# Patient Record
Sex: Female | Born: 1980 | Race: White | Hispanic: No | Marital: Married | Smoking: Never smoker
Health system: Southern US, Community
[De-identification: ages and names within clinical notes are randomized; demographics above are authoritative.]

## PROBLEM LIST (undated history)

## (undated) DIAGNOSIS — C801 Malignant (primary) neoplasm, unspecified: Secondary | ICD-10-CM

## (undated) DIAGNOSIS — N390 Urinary tract infection, site not specified: Secondary | ICD-10-CM

## (undated) DIAGNOSIS — Z9889 Other specified postprocedural states: Secondary | ICD-10-CM

## (undated) DIAGNOSIS — R112 Nausea with vomiting, unspecified: Secondary | ICD-10-CM

## (undated) DIAGNOSIS — K279 Peptic ulcer, site unspecified, unspecified as acute or chronic, without hemorrhage or perforation: Secondary | ICD-10-CM

## (undated) DIAGNOSIS — J45909 Unspecified asthma, uncomplicated: Secondary | ICD-10-CM

## (undated) HISTORY — PX: TUBAL LIGATION: SHX77

## (undated) HISTORY — PX: SKIN TAG REMOVAL: SHX780

## (undated) HISTORY — PX: BUNIONECTOMY: SHX129

## (undated) HISTORY — PX: LEEP: SHX91

## (undated) HISTORY — PX: TENDON REPAIR: SHX5111

## (undated) HISTORY — PX: BLADDER SURGERY: SHX569

## (undated) HISTORY — PX: OTHER SURGICAL HISTORY: SHX169

## (undated) HISTORY — PX: LUNG REMOVAL, PARTIAL: SHX233

## (undated) HISTORY — PX: CARPAL TUNNEL RELEASE: SHX101

## (undated) HISTORY — PX: CHOLECYSTECTOMY: SHX55

## (undated) HISTORY — PX: TONSILLECTOMY: SUR1361

## (undated) HISTORY — PX: SKIN CANCER EXCISION: SHX779

---

## 2021-07-10 ENCOUNTER — Other Ambulatory Visit: Payer: Self-pay | Admitting: Family Medicine

## 2021-07-10 DIAGNOSIS — G8929 Other chronic pain: Secondary | ICD-10-CM

## 2021-07-18 ENCOUNTER — Other Ambulatory Visit: Payer: Self-pay

## 2021-07-18 ENCOUNTER — Ambulatory Visit
Admission: RE | Admit: 2021-07-18 | Discharge: 2021-07-18 | Disposition: A | Discharge status: Home / Self Care | Payer: Medicare Other | Source: Ambulatory Visit | Attending: Family Medicine | Admitting: Family Medicine

## 2021-07-18 DIAGNOSIS — G8929 Other chronic pain: Secondary | ICD-10-CM | POA: Insufficient documentation

## 2021-07-18 DIAGNOSIS — M5441 Lumbago with sciatica, right side: Secondary | ICD-10-CM | POA: Diagnosis present

## 2021-07-20 ENCOUNTER — Other Ambulatory Visit: Payer: Self-pay

## 2021-07-20 ENCOUNTER — Ambulatory Visit
Admission: RE | Admit: 2021-07-20 | Discharge: 2021-07-20 | Disposition: A | Discharge status: Home / Self Care | Payer: Medicare Other | Source: Ambulatory Visit | Attending: General Surgery | Admitting: General Surgery

## 2021-07-20 ENCOUNTER — Other Ambulatory Visit: Payer: Self-pay | Admitting: General Surgery

## 2021-07-20 ENCOUNTER — Ambulatory Visit: Payer: Self-pay | Admitting: General Surgery

## 2021-07-20 DIAGNOSIS — K439 Ventral hernia without obstruction or gangrene: Secondary | ICD-10-CM | POA: Diagnosis not present

## 2021-07-26 ENCOUNTER — Encounter
Admission: RE | Admit: 2021-07-26 | Discharge: 2021-07-26 | Disposition: A | Discharge status: Home / Self Care | Payer: Medicare Other | Source: Ambulatory Visit | Attending: General Surgery | Admitting: General Surgery

## 2021-07-26 ENCOUNTER — Other Ambulatory Visit: Payer: Self-pay

## 2021-07-26 HISTORY — DX: Urinary tract infection, site not specified: N39.0

## 2021-07-26 HISTORY — DX: Peptic ulcer, site unspecified, unspecified as acute or chronic, without hemorrhage or perforation: K27.9

## 2021-07-26 HISTORY — DX: Nausea with vomiting, unspecified: R11.2

## 2021-07-26 HISTORY — DX: Malignant (primary) neoplasm, unspecified: C80.1

## 2021-07-26 HISTORY — DX: Other specified postprocedural states: Z98.890

## 2021-07-26 HISTORY — DX: Unspecified asthma, uncomplicated: J45.909

## 2021-07-26 NOTE — Patient Instructions (Signed)
Your procedure is scheduled on: 07/30/21 Report to [place]. To find out your arrival time please call [phone] between 1PM - 3PM on 07/27/21.  Remember: Instructions that are not followed completely may result in serious medical risk, up to and including death, or upon the discretion of your surgeon and anesthesiologist your surgery may need to be rescheduled.     _X__ 1. Do not eat food after midnight the night before your procedure.                 No gum chewing or hard candies. You may drink clear liquids up to 2 hours                 before you are scheduled to arrive for your surgery- DO not drink clear                 liquids within 2 hours of the start of your surgery.                 Clear Liquids include:  water, apple juice without pulp, clear carbohydrate                 drink such as Clearfast or Gatorade, Black Coffee or Tea (Do not add                 anything to coffee or tea). Diabetics water only  __X__2.  On the morning of surgery brush your teeth with toothpaste and water, you                 may rinse your mouth with mouthwash if you wish.  Do not swallow any              toothpaste of mouthwash.     _X__ 3.  No Alcohol for 24 hours before or after surgery.   _X__ 4.  Do Not Smoke or use e-cigarettes For 24 Hours Prior to Your Surgery.                 Do not use any chewable tobacco products for at least 6 hours prior to                 surgery.  ____  5.  Bring all medications with you on the day of surgery if instructed.   __X__  6.  Notify your doctor if there is any change in your medical condition      (cold, fever, infections).     Do not wear jewelry, make-up, hairpins, clips or nail polish. Do not wear lotions, powders, or perfumes.  Do not shave 48 hours prior to surgery. Men may shave face and neck. Do not bring valuables to the hospital.    [facility] is not responsible for any belongings  or valuables.  Contacts, dentures/partials or body piercings may not be worn into surgery. Bring a case for your contacts, glasses or hearing aids, a denture cup will be supplied. Leave your suitcase in the car. After surgery it may be brought to your room. For patients admitted to the hospital, discharge time is determined by your treatment team.   Patients discharged the day of surgery will not be allowed to drive home.   Please read over the following fact sheets that you were given:     __X__ Take these medicines the morning of surgery with A SIP OF WATER:  1. none  2.   3.   4.  5.  6.  ____ Fleet Enema (as directed)   ____ Use CHG Soap/SAGE wipes as directed  ____ Use inhalers on the day of surgery  ____ Stop metformin/Janumet/Farxiga 2 days prior to surgery    ____ Take 1/2 of usual insulin dose the night before surgery. No insulin the morning          of surgery.   ____ Stop Blood Thinners Coumadin/Plavix/Xarelto/Pleta/Pradaxa/Eliquis/Effient/Aspirin  on   Or contact your Surgeon, Cardiologist or Medical Doctor regarding  ability to stop your blood thinners  __X__ Stop Anti-inflammatories 7 days before surgery such as Advil, Ibuprofen, Motrin,  BC or Goodies Powder, Naprosyn, Naproxen, Aleve, Aspirin    __X__ Stop all herbal supplements, fish oil or vitamin E until after surgery. Stop all supplements today 07/26/21   ____ Bring C-Pap to the hospital.

## 2021-07-29 MED ORDER — LACTATED RINGERS IV SOLN: INTRAVENOUS | Status: DC

## 2021-07-29 MED ORDER — CEFAZOLIN SODIUM-DEXTROSE 2-4 GM/100ML-% IV SOLN
2.0000 g | INTRAVENOUS | Status: AC
  Administered 2021-07-30: 2 g via INTRAVENOUS

## 2021-07-29 MED ORDER — ORAL CARE MOUTH RINSE: 15.0000 mL | Freq: Once | OROMUCOSAL | Status: AC

## 2021-07-29 MED ORDER — FAMOTIDINE 20 MG PO TABS: 20.0000 mg | ORAL_TABLET | Freq: Once | ORAL | Status: AC

## 2021-07-29 MED ORDER — APREPITANT 40 MG PO CAPS: 40.0000 mg | ORAL_CAPSULE | Freq: Once | ORAL | Status: AC

## 2021-07-29 MED ORDER — CHLORHEXIDINE GLUCONATE 0.12 % MT SOLN: 15.0000 mL | Freq: Once | OROMUCOSAL | Status: AC

## 2021-07-30 ENCOUNTER — Encounter
Admission: RE | Disposition: A | Discharge status: Home / Self Care | Payer: Self-pay | Source: Home / Self Care | Attending: General Surgery

## 2021-07-30 ENCOUNTER — Ambulatory Visit
Admission: RE | Admit: 2021-07-30 | Discharge: 2021-07-30 | Disposition: A | Discharge status: Home / Self Care | Payer: Medicare Other | Attending: General Surgery | Admitting: General Surgery

## 2021-07-30 ENCOUNTER — Ambulatory Visit: Payer: Medicare Other | Admitting: Certified Registered"

## 2021-07-30 ENCOUNTER — Other Ambulatory Visit: Payer: Self-pay

## 2021-07-30 ENCOUNTER — Encounter: Payer: Self-pay | Admitting: General Surgery

## 2021-07-30 DIAGNOSIS — K436 Other and unspecified ventral hernia with obstruction, without gangrene: Secondary | ICD-10-CM | POA: Diagnosis not present

## 2021-07-30 DIAGNOSIS — Z85828 Personal history of other malignant neoplasm of skin: Secondary | ICD-10-CM | POA: Insufficient documentation

## 2021-07-30 DIAGNOSIS — Z902 Acquired absence of lung [part of]: Secondary | ICD-10-CM | POA: Insufficient documentation

## 2021-07-30 HISTORY — PX: INSERTION OF MESH: SHX5868

## 2021-07-30 HISTORY — PX: XI ROBOTIC ASSISTED VENTRAL HERNIA: SHX6789

## 2021-07-30 LAB — POCT PREGNANCY, URINE: Preg Test, Ur: NEGATIVE

## 2021-07-30 SURGERY — REPAIR, HERNIA, VENTRAL, ROBOT-ASSISTED
Anesthesia: General

## 2021-07-30 MED ORDER — EPHEDRINE SULFATE 50 MG/ML IJ SOLN
INTRAMUSCULAR | Status: DC | PRN
  Administered 2021-07-30: 5 mg via INTRAVENOUS

## 2021-07-30 MED ORDER — LIDOCAINE HCL (CARDIAC) PF 100 MG/5ML IV SOSY
PREFILLED_SYRINGE | INTRAVENOUS | Status: DC | PRN
  Administered 2021-07-30: 60 mg via INTRAVENOUS

## 2021-07-30 MED ORDER — FENTANYL CITRATE (PF) 100 MCG/2ML IJ SOLN
25.0000 ug | INTRAMUSCULAR | Status: AC | PRN
  Administered 2021-07-30 (×6): 25 ug via INTRAVENOUS

## 2021-07-30 MED ORDER — FENTANYL CITRATE (PF) 100 MCG/2ML IJ SOLN
INTRAMUSCULAR | Status: AC
  Filled 2021-07-30: qty 2

## 2021-07-30 MED ORDER — FAMOTIDINE 20 MG PO TABS
ORAL_TABLET | ORAL | Status: AC
  Administered 2021-07-30: 20 mg via ORAL
  Filled 2021-07-30: qty 1

## 2021-07-30 MED ORDER — ROCURONIUM BROMIDE 100 MG/10ML IV SOLN
INTRAVENOUS | Status: DC | PRN
  Administered 2021-07-30: 60 mg via INTRAVENOUS

## 2021-07-30 MED ORDER — ACETAMINOPHEN 10 MG/ML IV SOLN
INTRAVENOUS | Status: DC | PRN
  Administered 2021-07-30: 1000 mg via INTRAVENOUS

## 2021-07-30 MED ORDER — MIDAZOLAM HCL 2 MG/2ML IJ SOLN
INTRAMUSCULAR | Status: AC
  Filled 2021-07-30: qty 2

## 2021-07-30 MED ORDER — BUPIVACAINE-EPINEPHRINE (PF) 0.25% -1:200000 IJ SOLN
INTRAMUSCULAR | Status: AC
  Filled 2021-07-30: qty 30

## 2021-07-30 MED ORDER — CEFAZOLIN SODIUM-DEXTROSE 2-4 GM/100ML-% IV SOLN
INTRAVENOUS | Status: AC
  Filled 2021-07-30: qty 100

## 2021-07-30 MED ORDER — ACETAMINOPHEN 10 MG/ML IV SOLN
INTRAVENOUS | Status: AC
  Filled 2021-07-30: qty 100

## 2021-07-30 MED ORDER — DEXAMETHASONE SODIUM PHOSPHATE 10 MG/ML IJ SOLN
INTRAMUSCULAR | Status: DC | PRN
  Administered 2021-07-30: 10 mg via INTRAVENOUS

## 2021-07-30 MED ORDER — PROPOFOL 10 MG/ML IV BOLUS
INTRAVENOUS | Status: DC | PRN
  Administered 2021-07-30: 180 mg via INTRAVENOUS

## 2021-07-30 MED ORDER — ONDANSETRON HCL 4 MG/2ML IJ SOLN: 4.0000 mg | Freq: Once | INTRAMUSCULAR | Status: DC | PRN

## 2021-07-30 MED ORDER — HYDROCODONE-ACETAMINOPHEN 5-325 MG PO TABS
1.0000 | ORAL_TABLET | Freq: Once | ORAL | Status: AC
  Administered 2021-07-30: 1 via ORAL

## 2021-07-30 MED ORDER — SUGAMMADEX SODIUM 200 MG/2ML IV SOLN
INTRAVENOUS | Status: DC | PRN
  Administered 2021-07-30: 200 mg via INTRAVENOUS

## 2021-07-30 MED ORDER — MIDAZOLAM HCL 2 MG/2ML IJ SOLN
INTRAMUSCULAR | Status: DC | PRN
  Administered 2021-07-30: 2 mg via INTRAVENOUS

## 2021-07-30 MED ORDER — DEXMEDETOMIDINE (PRECEDEX) IN NS 20 MCG/5ML (4 MCG/ML) IV SYRINGE
PREFILLED_SYRINGE | INTRAVENOUS | Status: DC | PRN
  Administered 2021-07-30: 12 ug via INTRAVENOUS
  Administered 2021-07-30: 8 ug via INTRAVENOUS
  Administered 2021-07-30: 12 ug via INTRAVENOUS

## 2021-07-30 MED ORDER — BUPIVACAINE-EPINEPHRINE (PF) 0.25% -1:200000 IJ SOLN
INTRAMUSCULAR | Status: DC | PRN
  Administered 2021-07-30: 30 mL

## 2021-07-30 MED ORDER — FENTANYL CITRATE (PF) 100 MCG/2ML IJ SOLN
INTRAMUSCULAR | Status: AC
  Administered 2021-07-30: 25 ug via INTRAVENOUS
  Filled 2021-07-30: qty 2

## 2021-07-30 MED ORDER — ONDANSETRON HCL 4 MG/2ML IJ SOLN
INTRAMUSCULAR | Status: DC | PRN
  Administered 2021-07-30 (×2): 4 mg via INTRAVENOUS

## 2021-07-30 MED ORDER — CHLORHEXIDINE GLUCONATE 0.12 % MT SOLN
OROMUCOSAL | Status: AC
  Administered 2021-07-30: 15 mL via OROMUCOSAL
  Filled 2021-07-30: qty 15

## 2021-07-30 MED ORDER — KETOROLAC TROMETHAMINE 30 MG/ML IJ SOLN
INTRAMUSCULAR | Status: DC | PRN
  Administered 2021-07-30: 30 mg via INTRAVENOUS

## 2021-07-30 MED ORDER — HYDROCODONE-ACETAMINOPHEN 5-325 MG PO TABS
ORAL_TABLET | ORAL | Status: AC
  Filled 2021-07-30: qty 1

## 2021-07-30 MED ORDER — HYDROCODONE-ACETAMINOPHEN 5-325 MG PO TABS: 1.0000 | ORAL_TABLET | ORAL | 0 refills | Status: AC | PRN

## 2021-07-30 MED ORDER — GLYCOPYRROLATE 0.2 MG/ML IJ SOLN
INTRAMUSCULAR | Status: DC | PRN
  Administered 2021-07-30: .2 mg via INTRAVENOUS

## 2021-07-30 MED ORDER — APREPITANT 40 MG PO CAPS
ORAL_CAPSULE | ORAL | Status: AC
  Administered 2021-07-30: 40 mg via ORAL
  Filled 2021-07-30: qty 1

## 2021-07-30 MED ORDER — FENTANYL CITRATE (PF) 100 MCG/2ML IJ SOLN
INTRAMUSCULAR | Status: DC | PRN
  Administered 2021-07-30: 100 ug via INTRAVENOUS

## 2021-07-30 SURGICAL SUPPLY — 45 items
BLADE SURG SZ11 CARB STEEL (BLADE) ×3 IMPLANT
CHLORAPREP W/TINT 26 (MISCELLANEOUS) ×3 IMPLANT
COVER TIP SHEARS 8 DVNC (MISCELLANEOUS) ×2 IMPLANT
COVER TIP SHEARS 8MM DA VINCI (MISCELLANEOUS) ×1
COVER WAND RF STERILE (DRAPES) ×3 IMPLANT
DEFOGGER SCOPE WARMER CLEARIFY (MISCELLANEOUS) ×3 IMPLANT
DERMABOND ADVANCED (GAUZE/BANDAGES/DRESSINGS) ×1
DERMABOND ADVANCED .7 DNX12 (GAUZE/BANDAGES/DRESSINGS) ×2 IMPLANT
DRAPE ARM DVNC X/XI (DISPOSABLE) ×6 IMPLANT
DRAPE COLUMN DVNC XI (DISPOSABLE) ×2 IMPLANT
DRAPE DA VINCI XI ARM (DISPOSABLE) ×3
DRAPE DA VINCI XI COLUMN (DISPOSABLE) ×1
ELECT REM PT RETURN 9FT ADLT (ELECTROSURGICAL) ×3
ELECTRODE REM PT RTRN 9FT ADLT (ELECTROSURGICAL) ×2 IMPLANT
GAUZE 4X4 16PLY ~~LOC~~+RFID DBL (SPONGE) ×3 IMPLANT
GLOVE SURG ENC MOIS LTX SZ6.5 (GLOVE) ×9 IMPLANT
GLOVE SURG UNDER POLY LF SZ6.5 (GLOVE) ×9 IMPLANT
GOWN STRL REUS W/ TWL LRG LVL3 (GOWN DISPOSABLE) ×6 IMPLANT
GOWN STRL REUS W/TWL LRG LVL3 (GOWN DISPOSABLE) ×3
IRRIGATOR SUCT 8 DISP DVNC XI (IRRIGATION / IRRIGATOR) IMPLANT
IRRIGATOR SUCTION 8MM XI DISP (IRRIGATION / IRRIGATOR)
IV NS 1000ML (IV SOLUTION)
IV NS 1000ML BAXH (IV SOLUTION) IMPLANT
KIT PINK PAD W/HEAD ARE REST (MISCELLANEOUS) ×3
KIT PINK PAD W/HEAD ARM REST (MISCELLANEOUS) ×2 IMPLANT
LABEL OR SOLS (LABEL) ×3 IMPLANT
MANIFOLD NEPTUNE II (INSTRUMENTS) ×3 IMPLANT
MESH VENT LT ST 11.4CM CRL (Mesh General) IMPLANT
MESH VENTRALIGHT ST 4.5IN (Mesh General) ×3 IMPLANT
NEEDLE HYPO 22GX1.5 SAFETY (NEEDLE) ×3 IMPLANT
NEEDLE INSUFFLATION 14GA 120MM (NEEDLE) ×3 IMPLANT
OBTURATOR OPTICAL STANDARD 8MM (TROCAR) ×1
OBTURATOR OPTICAL STND 8 DVNC (TROCAR) ×2
OBTURATOR OPTICALSTD 8 DVNC (TROCAR) ×2 IMPLANT
PACK LAP CHOLECYSTECTOMY (MISCELLANEOUS) ×3 IMPLANT
SEAL CANN UNIV 5-8 DVNC XI (MISCELLANEOUS) ×6 IMPLANT
SEAL XI 5MM-8MM UNIVERSAL (MISCELLANEOUS) ×3
SET TUBE SMOKE EVAC HIGH FLOW (TUBING) ×3 IMPLANT
SOLUTION ELECTROLUBE (MISCELLANEOUS) ×3 IMPLANT
SUT MNCRL AB 4-0 PS2 18 (SUTURE) ×3 IMPLANT
SUT STRATAFIX PDS 30 CT-1 (SUTURE) ×3 IMPLANT
SUT VLOC 90 2/L VL 12 GS22 (SUTURE) ×3 IMPLANT
TAPE TRANSPORE STRL 2 31045 (GAUZE/BANDAGES/DRESSINGS) ×3 IMPLANT
TRAY FOLEY MTR SLVR 16FR STAT (SET/KITS/TRAYS/PACK) IMPLANT
WATER STERILE IRR 500ML POUR (IV SOLUTION) IMPLANT

## 2021-07-30 NOTE — Discharge Instructions (Addendum)
  Diet: Resume home heart healthy regular diet.   Activity: No heavy lifting >20 pounds (children, pets, laundry, garbage) or strenuous activity until follow-up, but light activity and walking are encouraged. Do not drive or drink alcohol if taking narcotic pain medications.  Wound care: May shower with soapy water and pat dry (do not rub incisions), but no baths or submerging incision underwater until follow-up. (no swimming)   Medications: Resume all home medications. For mild to moderate pain: acetaminophen (Tylenol) ***or ibuprofen (if no kidney disease). Combining Tylenol with alcohol can substantially increase your risk of causing liver disease. Narcotic pain medications, if prescribed, can be used for severe pain, though may cause nausea, constipation, and drowsiness. Do not combine Tylenol and Norco within a 6 hour period as Norco contains Tylenol. If you do not need the narcotic pain medication, you do not need to fill the prescription.  Call office ([phone]) at any time if any questions, worsening pain, fevers/chills, bleeding, drainage from incision site, or other concerns.   AMBULATORY SURGERY  DISCHARGE INSTRUCTIONS   The drugs that you were given will stay in your system until tomorrow so for the next 24 hours you should not:  Drive an automobile Make any legal decisions Drink any alcoholic beverage   You may resume regular meals tomorrow.  Today it is better to start with liquids and gradually work up to solid foods.  You may eat anything you prefer, but it is better to start with liquids, then soup and crackers, and gradually work up to solid foods.   Please notify your doctor immediately if you have any unusual bleeding, trouble breathing, redness and pain at the surgery site, drainage, fever, or pain not relieved by medication.    Additional Instructions:        Please contact your physician with any problems or Same Day Surgery at [phone], Monday  through Friday 6 am to 4 pm, or [place] [facility] number at [phone].  

## 2021-07-30 NOTE — Transfer of Care (Signed)
Immediate Anesthesia Transfer of Care Note  Patient: Barbara Goodwin  Procedure(s) Performed: XI ROBOTIC ASSISTED VENTRAL HERNIA INSERTION OF MESH  Patient Location: PACU  Anesthesia Type:General  Level of Consciousness: awake, drowsy and patient cooperative  Airway & Oxygen Therapy: Patient Spontanous Breathing and Patient connected to face mask oxygen  Post-op Assessment: Report given to RN and Post -op Vital signs reviewed and stable  Post vital signs: Reviewed and stable  Last Vitals:  Vitals Value Taken Time  BP 102/42 07/30/21 1241  Temp 36.5 C 07/30/21 1241  Pulse 57 07/30/21 1245  Resp 19 07/30/21 1245  SpO2 95 % 07/30/21 1245  Vitals shown include unvalidated device data.  Last Pain:  Vitals:   07/30/21 0848  TempSrc: Temporal  PainSc: 4          Complications: No notable events documented.

## 2021-07-30 NOTE — Anesthesia Preprocedure Evaluation (Signed)
Anesthesia Evaluation  Patient identified by MRN, date of birth, ID band Patient awake    Reviewed: Allergy & Precautions, H&P , NPO status , Patient's Chart, lab work & pertinent test results, reviewed documented beta blocker date and time   History of Anesthesia Complications (+) PONV and history of anesthetic complications  Airway Mallampati: II  TM Distance: >3 FB Neck ROM: full    Dental  (+) Teeth Intact   Pulmonary asthma ,    Pulmonary exam normal        Cardiovascular Exercise Tolerance: Good negative cardio ROS Normal cardiovascular exam Rhythm:regular Rate:Normal     Neuro/Psych negative neurological ROS  negative psych ROS   GI/Hepatic Neg liver ROS, PUD,   Endo/Other  negative endocrine ROS  Renal/GU negative Renal ROS  negative genitourinary   Musculoskeletal   Abdominal   Peds  Hematology negative hematology ROS (+)   Anesthesia Other Findings Past Medical History: No date: Asthma No date: Cancer ([place]) No date: PONV (postoperative nausea and vomiting) No date: PUD (peptic ulcer disease) No date: UTI (urinary tract infection) Past Surgical History: No date: BLADDER SURGERY     Comment:  hydrodistention x2 No date: BUNIONECTOMY; Bilateral No date: CARPAL TUNNEL RELEASE; Right No date: CHOLECYSTECTOMY No date: LEEP     Comment:  at age 40 for cancerous cells No date: LUNG REMOVAL, PARTIAL; Left     Comment:  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 No date: pinky surgery; Left No date: SKIN CANCER EXCISION No date: SKIN TAG REMOVAL; N/A     Comment:  buttocks No date: TENDON REPAIR; Left     Comment:  great toe No date: TONSILLECTOMY No date: TUBAL LIGATION BMI    Body Mass Index: 33.99 kg/m     Reproductive/Obstetrics negative OB ROS                             Anesthesia Physical Anesthesia Plan  ASA: 2  Anesthesia Plan: General ETT   Post-op Pain  Management:    Induction:   PONV Risk Score and Plan: 4 or greater  Airway Management Planned:   Additional Equipment:   Intra-op Plan:   Post-operative Plan:   Informed Consent: I have reviewed the patients History and Physical, chart, labs and discussed the procedure including the risks, benefits and alternatives for the proposed anesthesia with the patient or authorized representative who has indicated his/her understanding and acceptance.     Dental Advisory Given  Plan Discussed with: CRNA  Anesthesia Plan Comments:         Anesthesia Quick Evaluation

## 2021-07-30 NOTE — H&P (Signed)
PATIENT PROFILE: Barbara Goodwin is a 40 y.o. female who presents to the OR for surgical repair of ventral hernia.  PCP: Barbara Rossetti, PA  HISTORY OF PRESENT ILLNESS: Barbara Goodwin reports feeling a mass in the anterior abdomen for the last few months. She endorses that the mass is painful. Pain localized to the mid abdomen without pain radiation. She endorses had she feels pain which can stand and the mass protrudes. Pain is relieved with resting down and the mass reduce. Denies any abdominal distention nausea or vomiting. Denies any previous surgery on the area of the mass.  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/31/2021  S/P removal of lung (2007) 05/31/2021  Amenorrhea 05/31/2021    GENERAL REVIEW OF SYSTEMS:   General ROS: negative for - chills, fatigue, fever, weight gain or weight loss Allergy and Immunology ROS: negative for - hives  Hematological and Lymphatic ROS: negative for - bleeding problems or bruising, negative for palpable nodes Endocrine ROS: negative for - heat or cold intolerance, hair changes Respiratory ROS: negative for - cough, shortness of breath or wheezing Cardiovascular ROS: no chest pain or palpitations GI ROS: negative for nausea, vomiting, abdominal pain, diarrhea, constipation Musculoskeletal ROS: negative for - joint swelling or muscle pain Neurological ROS: negative for - confusion, syncope Dermatological ROS: negative for pruritus and rash Psychiatric: negative for anxiety, depression, difficulty sleeping and memory loss  MEDICATIONS: Current Outpatient Medications  Medication Sig Dispense Refill   BEE POLLEN ORAL Take by mouth   cyanocobalamin (VITAMIN B12) 1,000 mcg SL tablet Take by mouth   FUROsemide (LASIX) 20 MG tablet Take one tablet 1 daily for 2-3 days as needed for any edema. 30 tablet 2   Saccharomyces boulardii  (FLORASTOR) 250 mg capsule Take 250 mg by mouth 2 (two) times daily   No current facility-administered medications for this visit.   ALLERGIES: Patient has no known allergies.  PAST MEDICAL HISTORY: Past Medical History:  Diagnosis Date   History of abnormal cervical Pap smear  Pt was 40 yo   History of bleeding ulcers  stomach   Migraines   Skin cancer   PAST SURGICAL HISTORY: Past Surgical History:  Procedure Laterality Date   Bunion Removal Bilateral   Carpel Tunnel Surgery Right   CHOLECYSTECTOMY   hydrodesention of bladder  x 3   Left Lung Removal 2007   Pinky Surgery  x 3   Tendon Reattachment  Left foot, great toe   TONSILLECTOMY    FAMILY HISTORY: Family History  Problem Relation Age of Onset   Stroke Mother   Diabetes type II Mother    SOCIAL HISTORY: Social History   Socioeconomic History   Marital status: Married  Tobacco Use   Smoking status: Never Smoker   Smokeless tobacco: Never Used  Scientific laboratory technician Use: Never used  Substance and Sexual Activity   Alcohol use: Yes  Comment: beer - socially   Drug use: Never   Sexual activity: Defer   PHYSICAL EXAM: Vitals:  07/20/21 1008  BP: 137/71  Pulse: 92   Body mass index is 36.21 kg/m. Weight: 89.8 kg (198 lb)   GENERAL: Alert, active, oriented x3  HEENT: Pupils equal reactive to light. Extraocular movements are intact. Sclera clear. Palpebral conjunctiva normal red color.Pharynx clear.  NECK: Supple with no palpable mass and no adenopathy.  LUNGS: Sound clear with no rales  rhonchi or wheezes.  HEART: Regular rhythm S1 and S2 without murmur.  ABDOMEN: Soft and depressible, nontender with no palpable mass, no hepatomegaly. Small palpable knot above the umbilical area on the abdomen and midline. Difficult to appreciate hernia defect.  EXTREMITIES: Well-developed well-nourished symmetrical with no dependent edema.  NEUROLOGICAL: Awake alert oriented, facial expression symmetrical,  moving all extremities.  REVIEW OF DATA: I have reviewed the following data today: Initial consult on 05/31/2021  Component Date Value   WBC (White Blood Cell Co* 05/31/2021 6.8   RBC (Red Blood Cell Coun* 05/31/2021 4.37   Hemoglobin 05/31/2021 13.6   Hematocrit 05/31/2021 39.8   MCV (Mean Corpuscular Vo* 05/31/2021 91.1   MCH (Mean Corpuscular He* 05/31/2021 31.1   MCHC (Mean Corpuscular H* 05/31/2021 34.2   Platelet Count 05/31/2021 276   RDW-CV (Red Cell Distrib* 05/31/2021 11.9   MPV (Mean Platelet Volum* 05/31/2021 10.0   Neutrophils 05/31/2021 4.02   Lymphocytes 05/31/2021 1.89   Monocytes 05/31/2021 0.56   Eosinophils 05/31/2021 0.22   Basophils 05/31/2021 0.04   Neutrophil % 05/31/2021 59.5   Lymphocyte % 05/31/2021 28.0   Monocyte % 05/31/2021 8.3   Eosinophil % 05/31/2021 3.3   Basophil% 05/31/2021 0.6   Immature Granulocyte % 05/31/2021 0.3   Immature Granulocyte Cou* 05/31/2021 0.02   Glucose 05/31/2021 84   Sodium 05/31/2021 139   Potassium 05/31/2021 3.6   Chloride 05/31/2021 101   Carbon Dioxide (CO2) 05/31/2021 31.1   Urea Nitrogen (BUN) 05/31/2021 13   Creatinine 05/31/2021 0.7   Glomerular Filtration Ra* 05/31/2021 92   Calcium 05/31/2021 9.3   AST 05/31/2021 13   ALT 05/31/2021 11   Alk Phos (alkaline Phosp* 05/31/2021 65   Albumin 05/31/2021 4.0   Bilirubin, Total 05/31/2021 0.5   Protein, Total 05/31/2021 6.4   A/G Ratio 05/31/2021 1.7   Cholesterol, Total 05/31/2021 231 (!)   Triglyceride 05/31/2021 48   HDL (High Density Lipopr* 05/31/2021 71.8   LDL Calculated 05/31/2021 150 (!)   VLDL Cholesterol 05/31/2021 10   Cholesterol/HDL Ratio 05/31/2021 3.2   Thyroid Stimulating Horm* 05/31/2021 1.452   Color 05/31/2021 Yellow   Clarity 05/31/2021 Clear   Specific Gravity 05/31/2021 1.020   pH, Urine 05/31/2021 6.0   Protein, Urinalysis 05/31/2021 Negative   Glucose, Urinalysis 05/31/2021 Negative   Ketones, Urinalysis 05/31/2021 Negative    Blood, Urinalysis 05/31/2021 Negative   Nitrite, Urinalysis 05/31/2021 Negative   Leukocyte Esterase, Urin* 05/31/2021 Negative   White Blood Cells, Urina* 05/31/2021 0-3   Red Blood Cells, Urinaly* 05/31/2021 None Seen   Bacteria, Urinalysis 05/31/2021 Few (!)   Squamous Epithelial Cell* 05/31/2021 Rare   Hemoglobin A1C 05/31/2021 5.3   Average Blood Glucose (C* 05/31/2021 105    ASSESSMENT: Ms. Simeone is a 40 y.o. female presenting for consultation for abdominal wall mass.  For the patient's symptoms and history and physical exam I think that the cause of the mass is most likely a small ventral hernia. Due to difficulty to appreciate the hernia defect on physical exam, patient will benefit of CT scan of the abdomen and pelvis for evaluation of abdominal wall. If patient has abdominal hernia I would recommend robotic assisted laparoscopic hernia repair. I discussed with the patient surgical technique of robotic assisted laparoscopic ventral hernia repair. I discussed with the patient the benefit and risk of surgery. She reports she understood and agreed to proceed with surgery if indicated.  CT scan confirmed the supraumbilical hernia. Patient oriented about CT scan results. She  agreed to proceed with hernia repair after discussion of benefits and risks.   Ventral hernia without obstruction or gangrene [K43.9]  PLAN: 1. Robotic assisted ventral hernia repair  Patient verbalized understanding, all questions were answered, and were agreeable with the plan outlined above.   Herbert Pun, MD

## 2021-07-30 NOTE — Op Note (Signed)
Preoperative diagnosis: Ventral Hernia  Postoperative diagnosis: Ventral Hernia  Procedure: Robotic assisted laparoscopic incarcerated ventral hernia repair with mesh  Anesthesia: General  Surgeon: Dr. Windell Moment  Wound Classification: Clean  Specimen: None  Complications: None  Estimated Blood Loss: 22ml  Indications: Patient is a 40 y.o. female developed a ventral hernia. This was symptomatic and incarcerated and repair was indicated.   Findings: 2 cm ventral hernia with incarcerated fat  2. Repair achieved with closure of the anterior fascia at midline and 11.4 cm round Bard mesh 3. Adequate hemostasis  Description of procedure: The patient was brought to the operating room and general anesthesia was induced. A time-out was completed verifying correct patient, procedure, site, positioning, and implant(s) and/or special equipment prior to beginning this procedure. Antibiotics were administered prior to making the incision. SCDs placed. The anterior abdominal wall was prepped and draped in the standard sterile fashion.   Palmer's point chosen for entry.  Veress needle placed and abdomen insufflated to 15cm without any dramatic increase in pressure.  Needle removed and optiview technique used to place 77mm port at same point.  No injury noted during placement.  Two additional ports, 55mm x2 along left lateral aspect placed.  Xi robot then docked into place.  Hernia contents noted and reduced with combination of blunt, sharp dissection with scissors and fenestrated forceps.  Hemostasis achieved throughout this portion.  Once all hernia contents reduced, there was noted to be a 2 cm ventral hernia with incarcerated fat.    Insufflation dropped to 42mm and transfacial suture with 0 stratafix used to primarily close defect under minimal tension. Bard protected 11.4 cm mesh was placed within the abdominal cavity and secured to the abdominal wall centered over the defect using the 0  stratafix previously used to primarily close defect.  The mesh was then circumferentially sutured into the anterior abdominal wall using 2-0 VLock x2.  Any bleeding noted during this portion was no longer actively bleeding by end of securing mesh and tightening the suture.    Robot was undocked.  Abdomen then desufflated while camera within abdomen to ensure no signs of new bleed prior to removing camera and rest of ports completely.  All skin incisions closed with runninrg 4-0 Monocryl in a subcuticular fashion.  All wounds then dressed with Dermabond.  Patient was then successfully awakened and transferred to PACU in stable condition.  At the end of the procedure sponge and instrument counts were correct.

## 2021-07-30 NOTE — Anesthesia Procedure Notes (Signed)
Procedure Name: Intubation Date/Time: 07/30/2021 11:01 AM Performed by: Kelton Pillar, CRNA Pre-anesthesia Checklist: Patient identified, Emergency Drugs available, Suction available and Patient being monitored Patient Re-evaluated:Patient Re-evaluated prior to induction Oxygen Delivery Method: Circle system utilized Preoxygenation: Pre-oxygenation with 100% oxygen Induction Type: IV induction Ventilation: Mask ventilation without difficulty Laryngoscope Size: McGraph and 3 Grade View: Grade I Tube type: Oral Tube size: 7.0 mm Number of attempts: 1 Airway Equipment and Method: Stylet and Oral airway Placement Confirmation: ETT inserted through vocal cords under direct vision, positive ETCO2, breath sounds checked- equal and bilateral and CO2 detector Secured at: 21 cm Tube secured with: Tape Dental Injury: Teeth and Oropharynx as per pre-operative assessment

## 2021-07-31 NOTE — Anesthesia Postprocedure Evaluation (Signed)
Anesthesia Post Note  Patient: Barbara Goodwin  Procedure(s) Performed: XI ROBOTIC ASSISTED VENTRAL HERNIA INSERTION OF MESH  Patient location during evaluation: PACU Anesthesia Type: General Level of consciousness: awake and alert Pain management: pain level controlled Vital Signs Assessment: post-procedure vital signs reviewed and stable Respiratory status: spontaneous breathing, nonlabored ventilation, respiratory function stable and patient connected to nasal cannula oxygen Cardiovascular status: blood pressure returned to baseline and stable Postop Assessment: no apparent nausea or vomiting Anesthetic complications: no   No notable events documented.   Last Vitals:  Vitals:   07/30/21 1345 07/30/21 1358  BP: (!) 98/49 103/66  Pulse: 88   Resp: (!) 22 20  Temp:  36.7 C  SpO2: 93% 97%    Last Pain:  Vitals:   07/30/21 1358  TempSrc: Temporal  PainSc: 3                  Molli Barrows

## 2021-08-08 ENCOUNTER — Other Ambulatory Visit: Payer: Self-pay | Admitting: Obstetrics and Gynecology

## 2021-08-08 DIAGNOSIS — Z1231 Encounter for screening mammogram for malignant neoplasm of breast: Secondary | ICD-10-CM

## 2021-08-22 ENCOUNTER — Other Ambulatory Visit (HOSPITAL_BASED_OUTPATIENT_CLINIC_OR_DEPARTMENT_OTHER): Payer: Self-pay | Admitting: Family Medicine

## 2021-08-22 ENCOUNTER — Other Ambulatory Visit: Payer: Self-pay | Admitting: Family Medicine

## 2021-08-22 DIAGNOSIS — G43009 Migraine without aura, not intractable, without status migrainosus: Secondary | ICD-10-CM

## 2021-08-30 ENCOUNTER — Ambulatory Visit
Admission: RE | Admit: 2021-08-30 | Discharge: 2021-08-30 | Disposition: A | Discharge status: Home / Self Care | Payer: Medicare Other | Source: Ambulatory Visit | Attending: Obstetrics and Gynecology | Admitting: Obstetrics and Gynecology

## 2021-08-30 ENCOUNTER — Other Ambulatory Visit: Payer: Self-pay

## 2021-08-30 DIAGNOSIS — Z1231 Encounter for screening mammogram for malignant neoplasm of breast: Secondary | ICD-10-CM | POA: Insufficient documentation

## 2021-09-05 ENCOUNTER — Ambulatory Visit
Admission: RE | Admit: 2021-09-05 | Discharge: 2021-09-05 | Disposition: A | Discharge status: Home / Self Care | Payer: Medicare Other | Source: Ambulatory Visit | Attending: Family Medicine | Admitting: Family Medicine

## 2021-09-05 DIAGNOSIS — G43009 Migraine without aura, not intractable, without status migrainosus: Secondary | ICD-10-CM | POA: Diagnosis not present

## 2021-09-26 ENCOUNTER — Other Ambulatory Visit: Payer: Self-pay | Admitting: Family Medicine

## 2021-09-26 DIAGNOSIS — M542 Cervicalgia: Secondary | ICD-10-CM

## 2021-09-26 DIAGNOSIS — G8929 Other chronic pain: Secondary | ICD-10-CM

## 2021-10-25 ENCOUNTER — Ambulatory Visit: Payer: Medicare Other

## 2022-09-28 IMAGING — CT CT HEAD W/O CM
4 series · 16 of 47 positions shown, 18 images · non-contrast
Comparison: None.

CLINICAL DATA: Patient complains of right sided headache since
having surgery 07/30/21. Patient nauseous and has sensitivity to
light as well.

EXAM:
CT HEAD WITHOUT CONTRAST
TECHNIQUE: Contiguous axial images were obtained from the base of the skull
through the vertex without intravenous contrast.

[Series 2: head wo · axial · 0.44mm/px · z∈[-114,-4]mm · 7 of 30 slices shown, 9 images]
[im 4/30  brain]
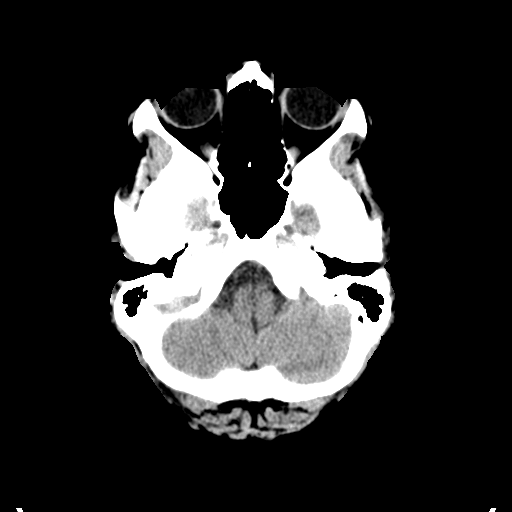
[im 4/30  bone]
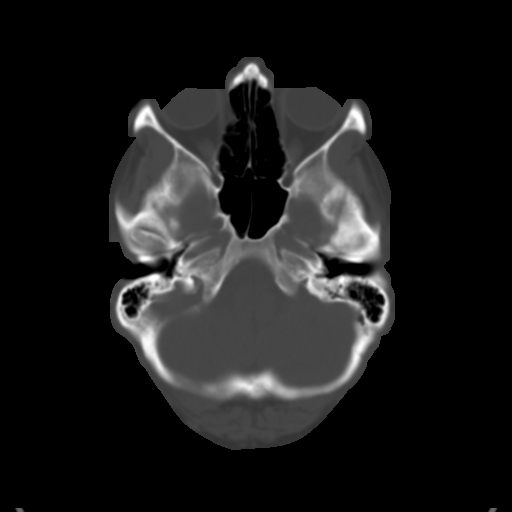
[im 8/30  brain]
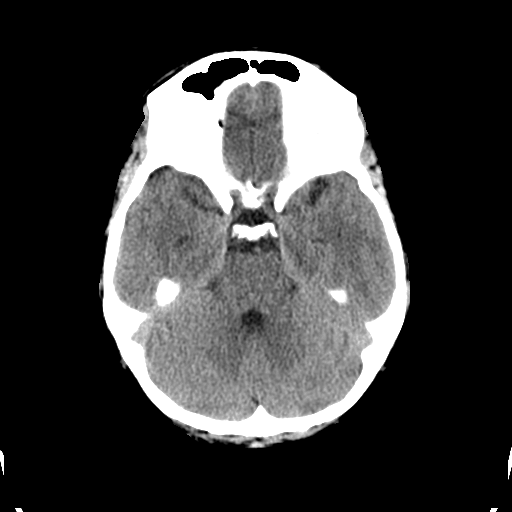
[im 11/30  brain]
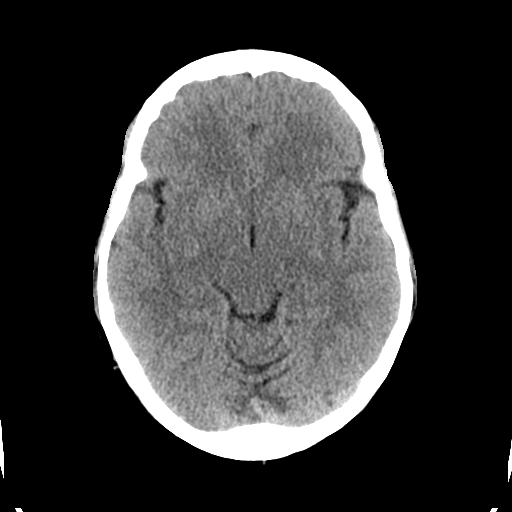
[im 15/30  brain]
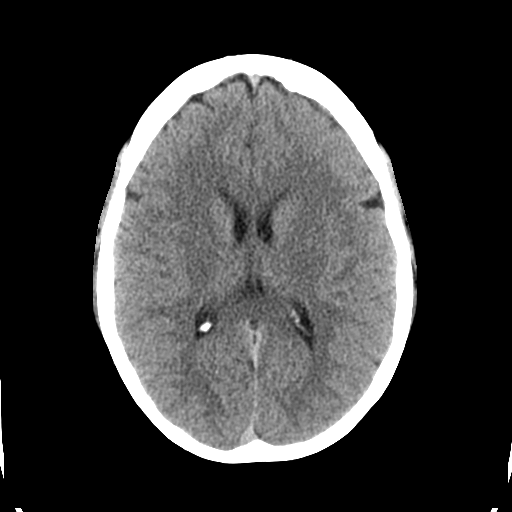
[im 19/30  brain]
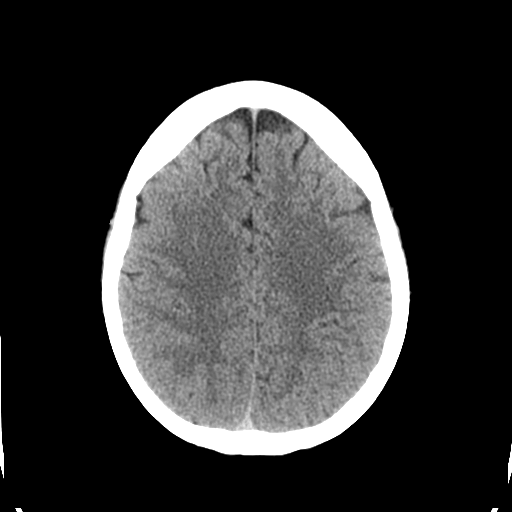
[im 19/30  bone]
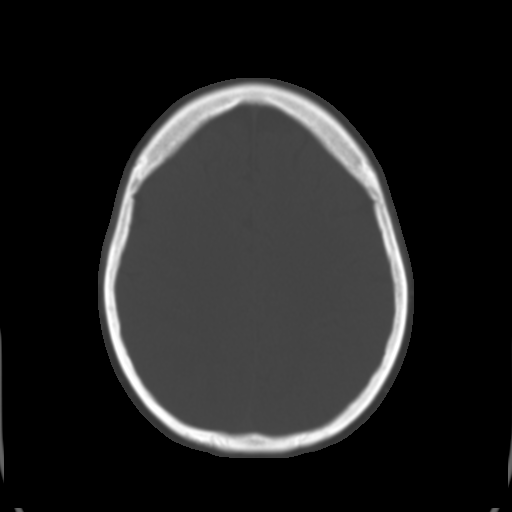
[im 22/30  brain]
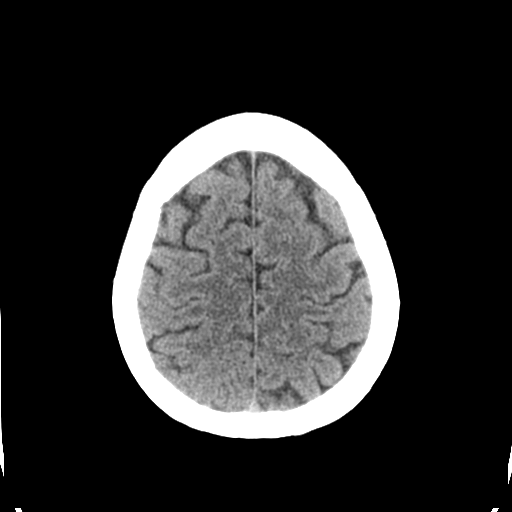
[im 26/30  brain]
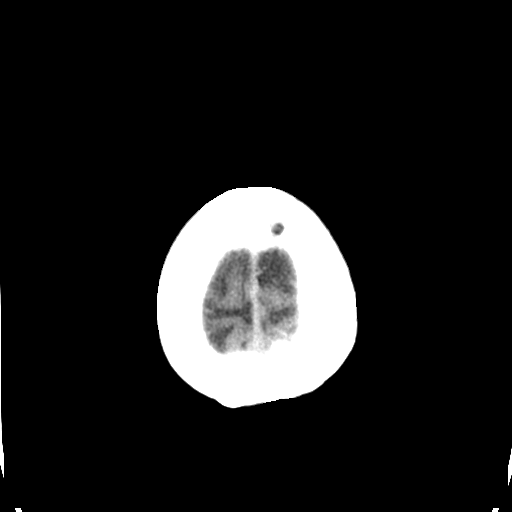

[Series 3: head bone · axial · 0.44mm/px · z∈[-115,-87]mm · 3 of 74 slices shown]
[im 8/74  bone]
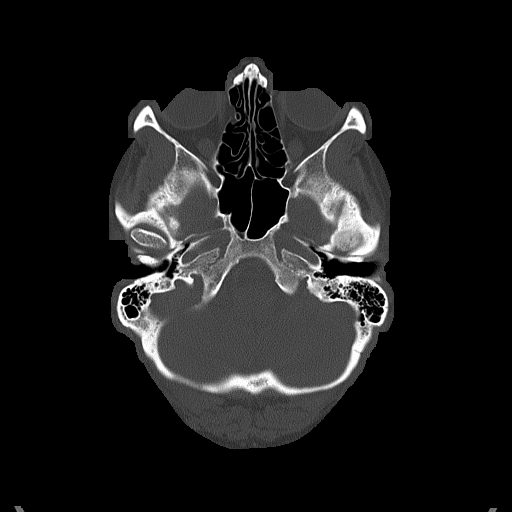
[im 15/74  bone]
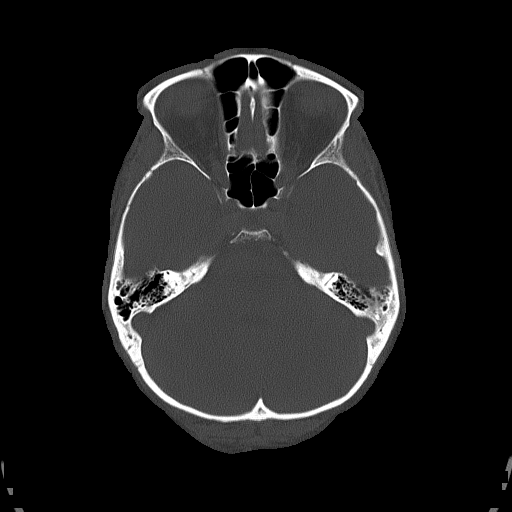
[im 22/74  bone]
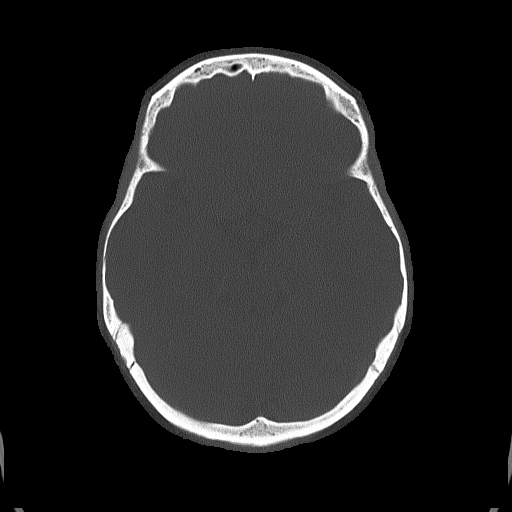

[Series 4: coronal soft tissue · coronal · 0.29mm/px · 3 of 61 slices shown]
[im 21/61  brain]
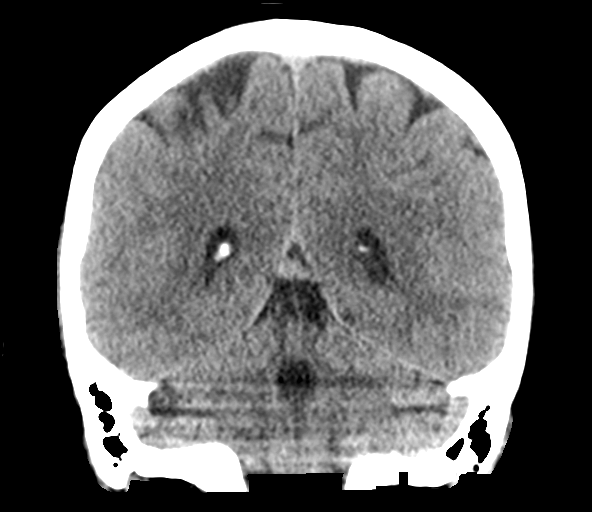
[im 27/61  brain]
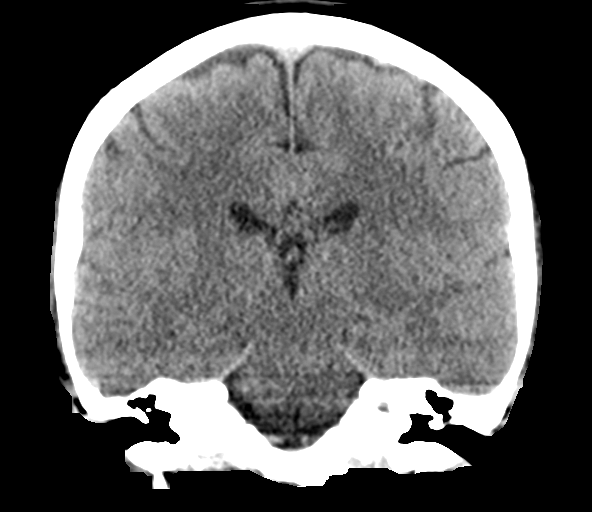
[im 34/61  brain]
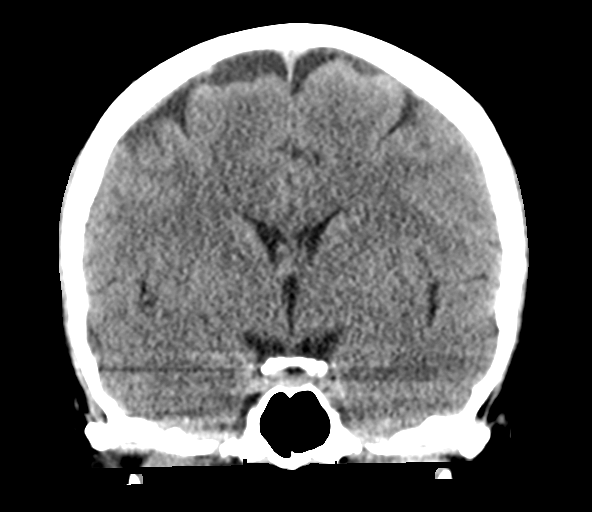

[Series 5: sagittal soft tissue · sagittal · 0.30mm/px · 3 of 50 slices shown]
[im 17/50  brain]
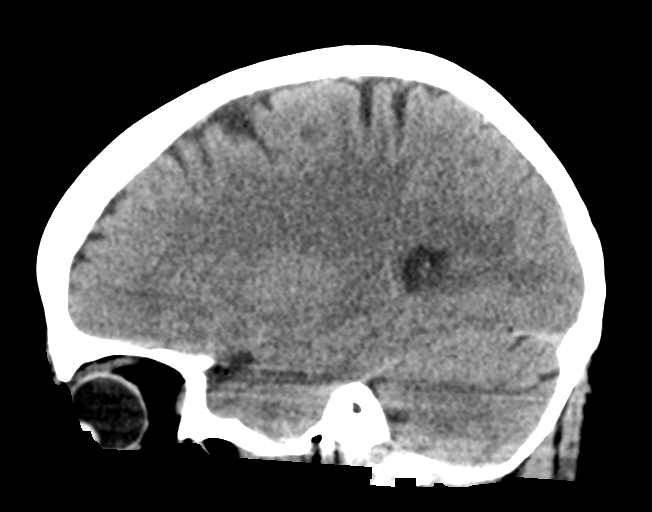
[im 25/50  brain]
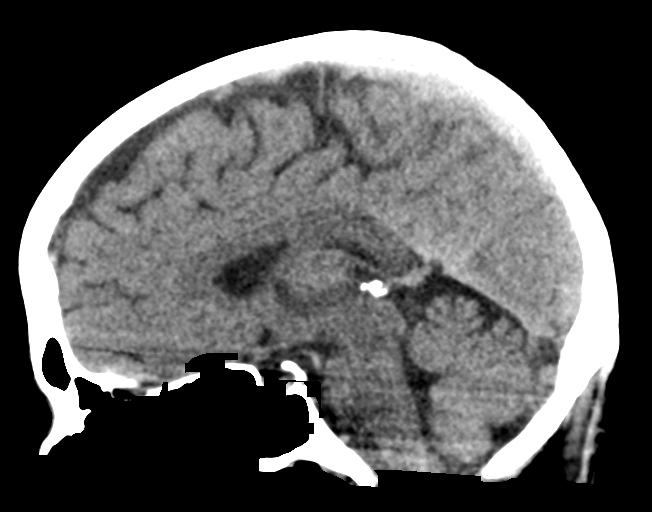
[im 33/50  brain]
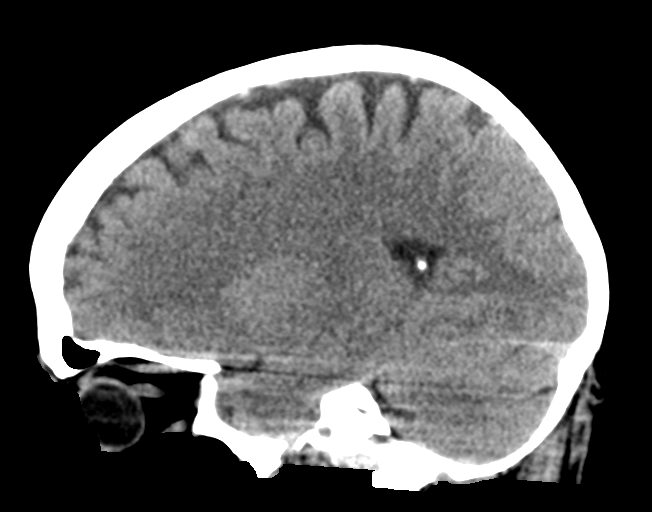

[16 of 47 positions shown; findings below may reference images not displayed]

FINDINGS: Brain:

No evidence of large-territorial acute infarction. No parenchymal
hemorrhage. No mass lesion. No extra-axial collection.

No mass effect or midline shift. No hydrocephalus. Basilar cisterns
are patent.

Vascular: No hyperdense vessel.

Skull: No acute fracture or focal lesion.

Sinuses/Orbits: Paranasal sinuses and mastoid air cells are clear.
The orbits are unremarkable.

Other: None.
IMPRESSION: No acute intracranial abnormality.

## 2023-11-07 ENCOUNTER — Other Ambulatory Visit: Payer: Self-pay | Admitting: Family Medicine

## 2023-11-07 DIAGNOSIS — Z1231 Encounter for screening mammogram for malignant neoplasm of breast: Secondary | ICD-10-CM

## 2024-05-25 ENCOUNTER — Ambulatory Visit
Admission: RE | Admit: 2024-05-25 | Discharge: 2024-05-25 | Disposition: A | Discharge status: Home / Self Care | Source: Ambulatory Visit | Attending: Family Medicine | Admitting: Family Medicine

## 2024-05-25 DIAGNOSIS — Z1231 Encounter for screening mammogram for malignant neoplasm of breast: Secondary | ICD-10-CM | POA: Insufficient documentation

## 2024-05-26 ENCOUNTER — Encounter

## 2024-06-29 ENCOUNTER — Other Ambulatory Visit: Payer: Self-pay | Admitting: Family Medicine

## 2024-06-29 ENCOUNTER — Ambulatory Visit
Admission: RE | Admit: 2024-06-29 | Discharge: 2024-06-29 | Disposition: A | Discharge status: Home / Self Care | Source: Ambulatory Visit | Attending: Family Medicine | Admitting: Family Medicine

## 2024-06-29 DIAGNOSIS — M5412 Radiculopathy, cervical region: Secondary | ICD-10-CM | POA: Diagnosis present
